# Patient Record
Sex: Male | Born: 1940 | Race: White | Hispanic: No | Marital: Single
Health system: Southern US, Community
[De-identification: ages and names within clinical notes are randomized; demographics above are authoritative.]

---

## 2007-10-10 ENCOUNTER — Emergency Department: Payer: Self-pay | Admitting: Internal Medicine

## 2007-10-10 ENCOUNTER — Other Ambulatory Visit: Payer: Self-pay

## 2008-01-10 ENCOUNTER — Other Ambulatory Visit: Payer: Self-pay

## 2008-01-10 ENCOUNTER — Inpatient Hospital Stay: Payer: Self-pay | Admitting: Specialist

## 2008-04-13 ENCOUNTER — Other Ambulatory Visit: Payer: Self-pay

## 2008-04-13 ENCOUNTER — Emergency Department: Payer: Self-pay | Admitting: Emergency Medicine

## 2008-04-23 ENCOUNTER — Other Ambulatory Visit: Payer: Self-pay

## 2008-04-23 ENCOUNTER — Emergency Department: Payer: Self-pay | Admitting: Emergency Medicine

## 2008-12-13 ENCOUNTER — Ambulatory Visit: Payer: Self-pay | Admitting: Gastroenterology

## 2010-03-01 IMAGING — CT CT HEAD WITHOUT CONTRAST
2 series · 16 of 30 positions shown, 20 images · non-contrast
Comparison: none

REASON FOR EXAM: R hand and R face weakness
COMMENTS:

[Series 2: without · axial · non-contrast · 0.42mm/px · z∈[+556,+680]mm · 13 of 31 slices shown, 17 images]
[im 3/31  brain]
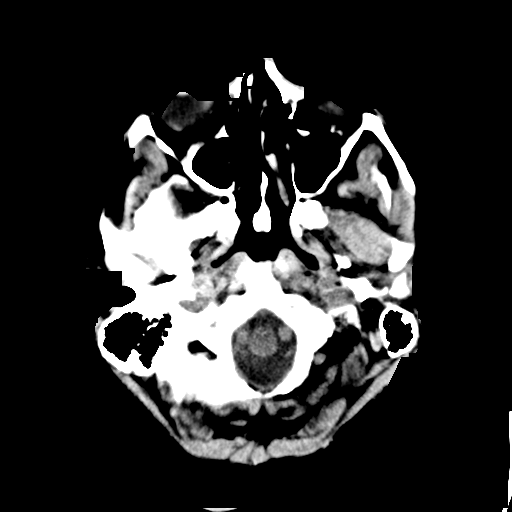
[im 3/31  bone]
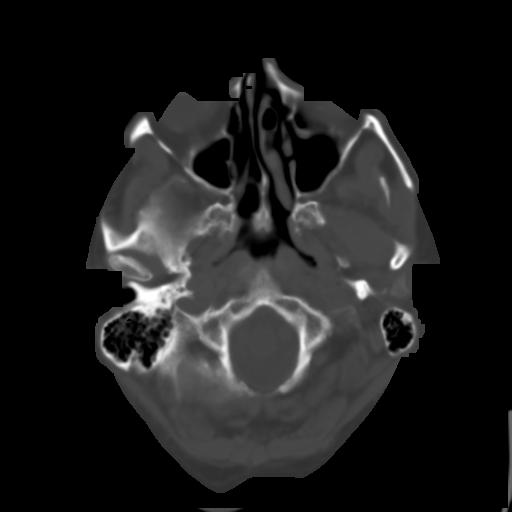
[im 5/31  brain]
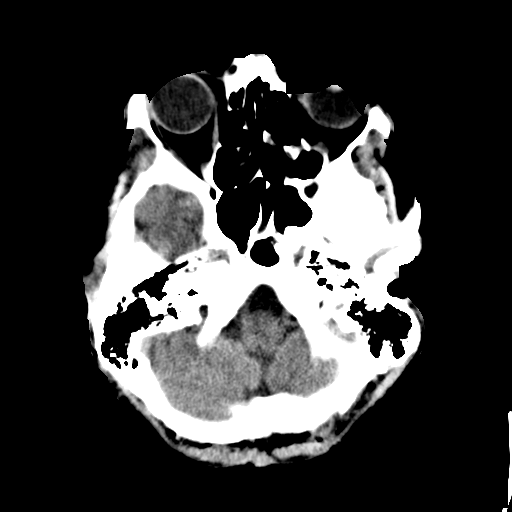
[im 7/31  brain]
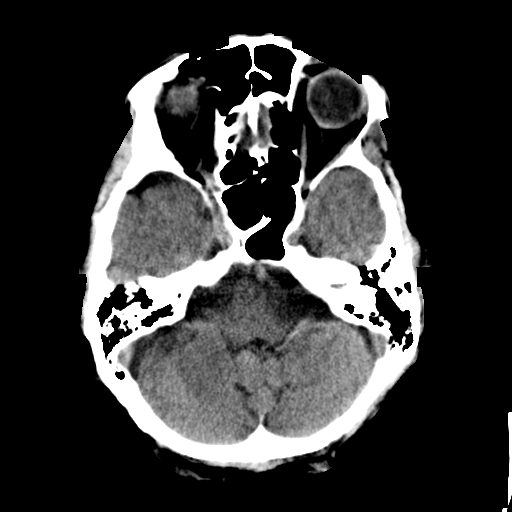
[im 9/31  brain]
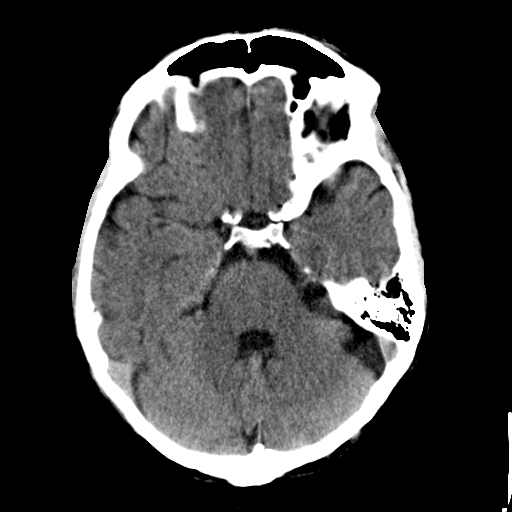
[im 11/31  brain]
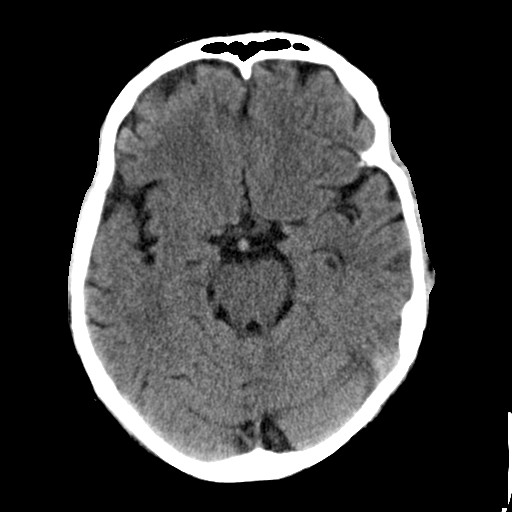
[im 11/31  bone]
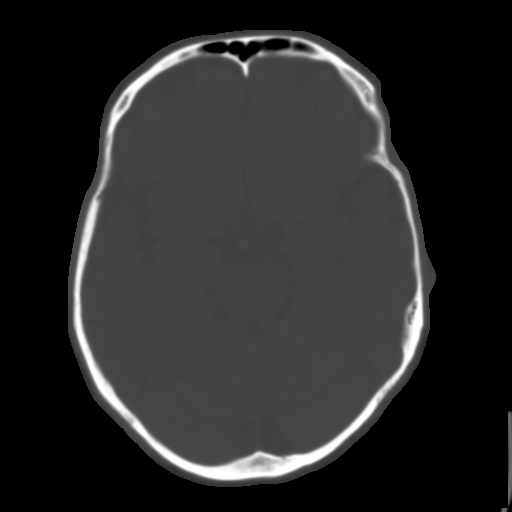
[im 13/31  brain]
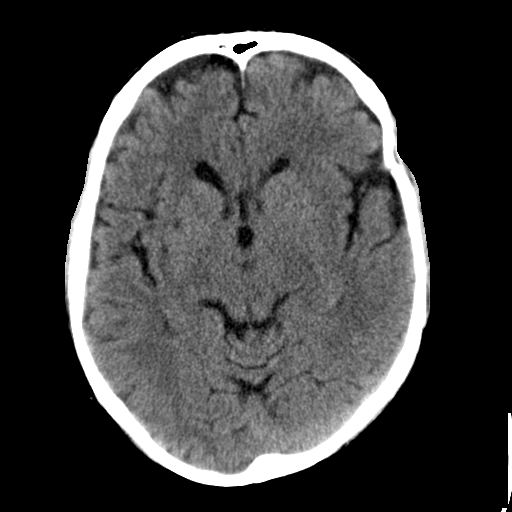
[im 16/31  brain]
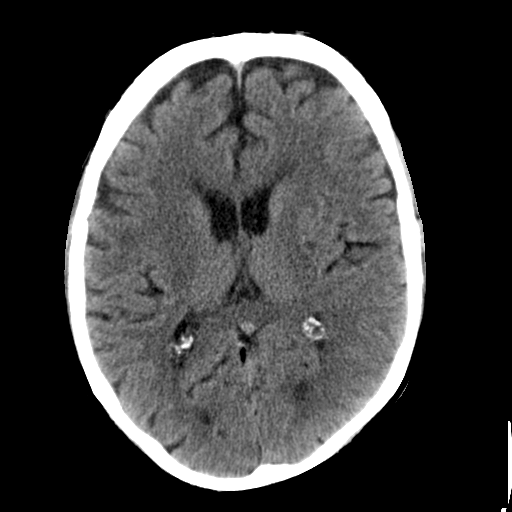
[im 18/31  brain]
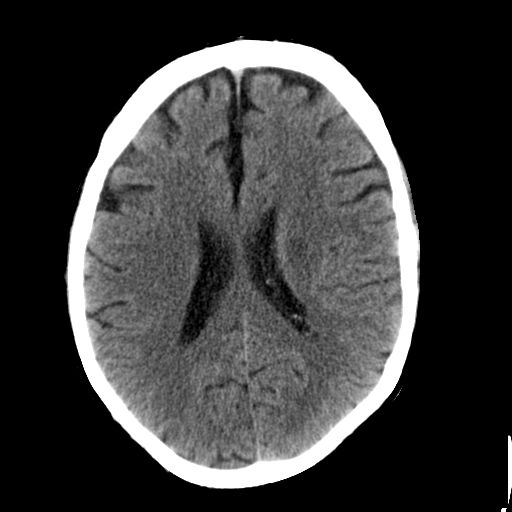
[im 20/31  brain]
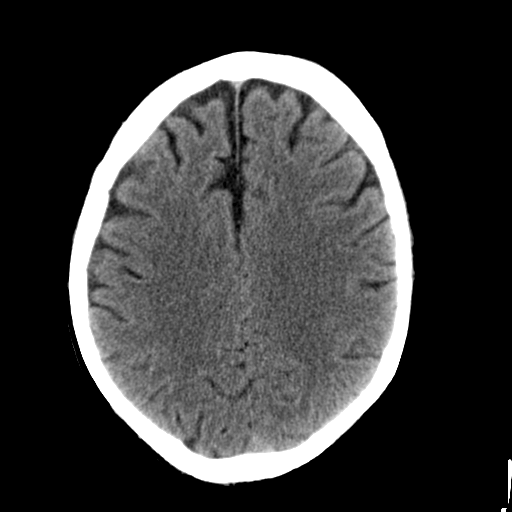
[im 20/31  bone]
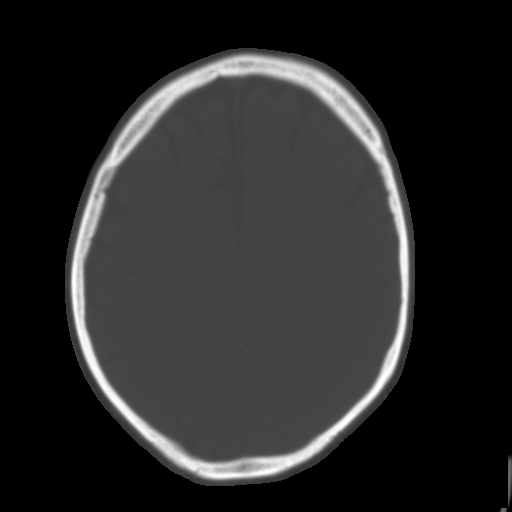
[im 22/31  brain]
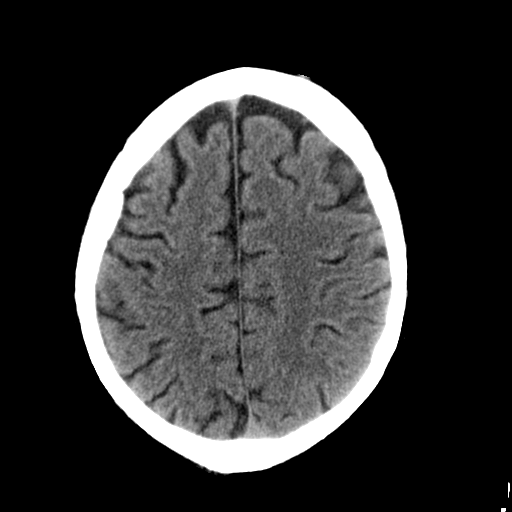
[im 24/31  brain]
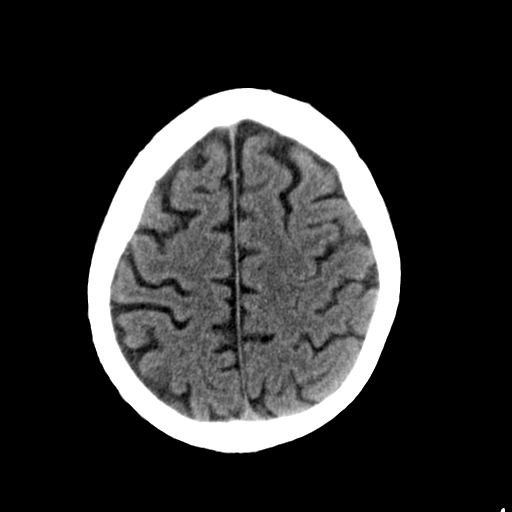
[im 26/31  brain]
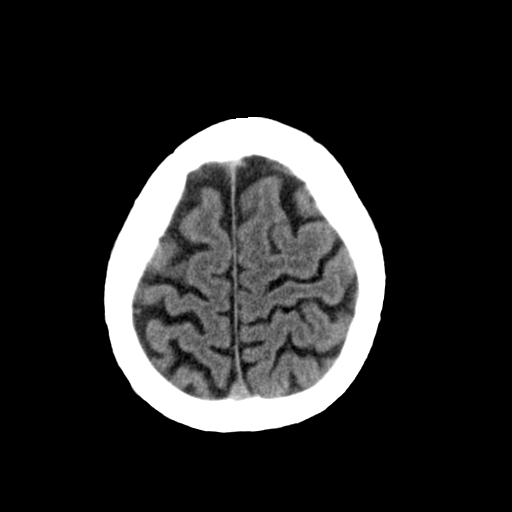
[im 28/31  brain]
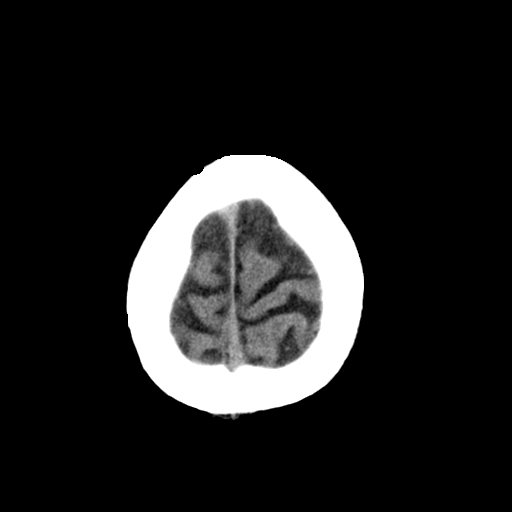
[im 28/31  bone]
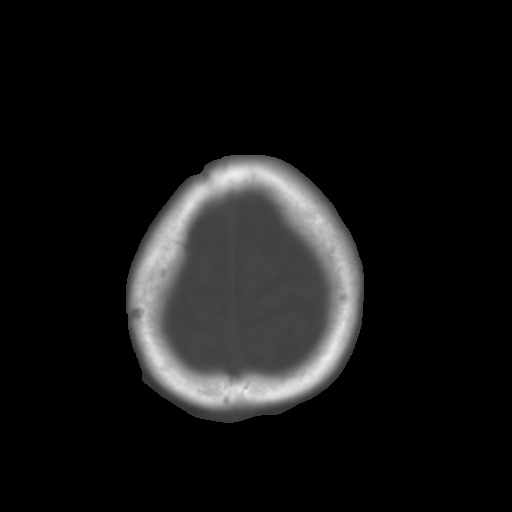

[Series 3: bone · axial · 0.42mm/px · z∈[+556,+596]mm · 3 of 31 slices shown]
[im 3/31  bone]
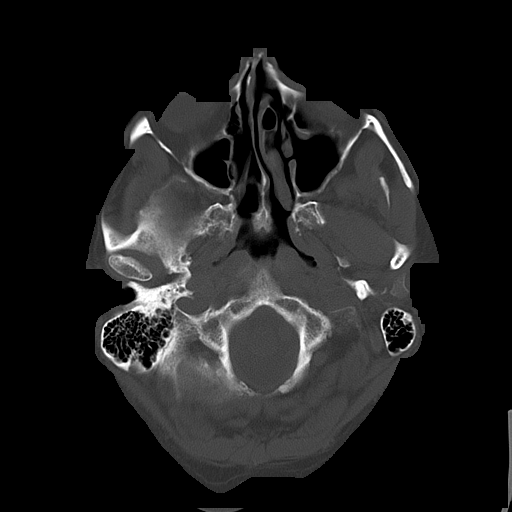
[im 7/31  bone]
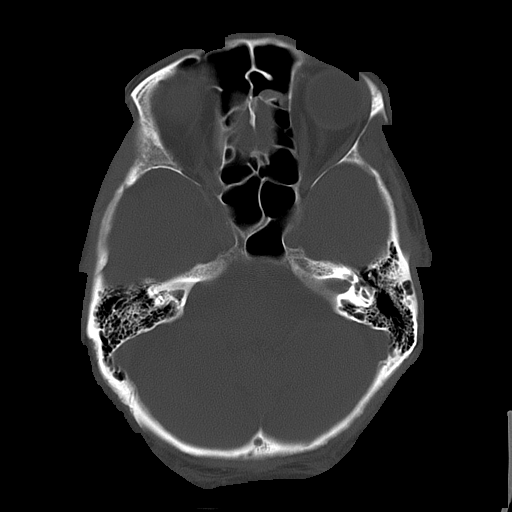
[im 11/31  bone]
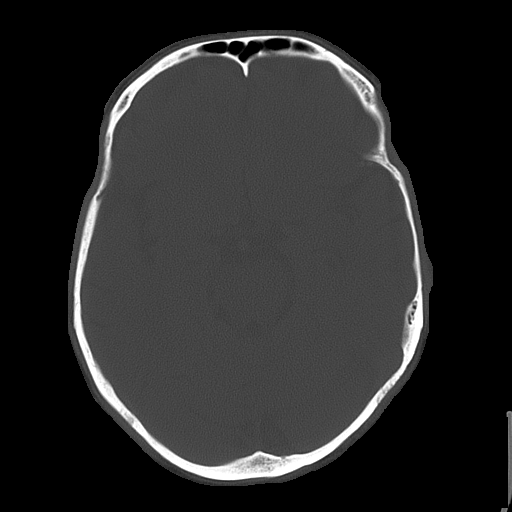

[16 of 30 positions shown; findings below may reference images not displayed]

PROCEDURE:     CT  - CT HEAD WITHOUT CONTRAST  - January 10, 2008 [DATE]

RESULT:     Noncontrast emergent CT of the brain is performed in the
standard fashion. There is no previous examination for comparison.

There is age-appropriate prominence of the ventricles and sulci. There does
not appear to be definite effacement, hemorrhage, mass, mass-effect or
midline shift. No extra-axial hematoma is evident. There is no territorial
infarct. The included paranasal sinuses demonstrate mucosal thickening
without pacing significant air-fluid level. The calvarium is intact. The
mastoids appear to be normal.
IMPRESSION: Evidence of age-appropriate atrophy. No acute intracranial
abnormality evident. Mucosal thickening is noted incidentally in the
paranasal sinuses. MRI followup may be beneficial.

## 2010-03-01 IMAGING — US US CAROTID DUPLEX BILAT
1 series · 17 of 24 positions shown · non-contrast
Comparison: none

REASON FOR EXAM: CVA/TIA
COMMENTS:

[Series 1: us carotid duplex bilat · 17 of 44 slices shown]
[im 1/44]
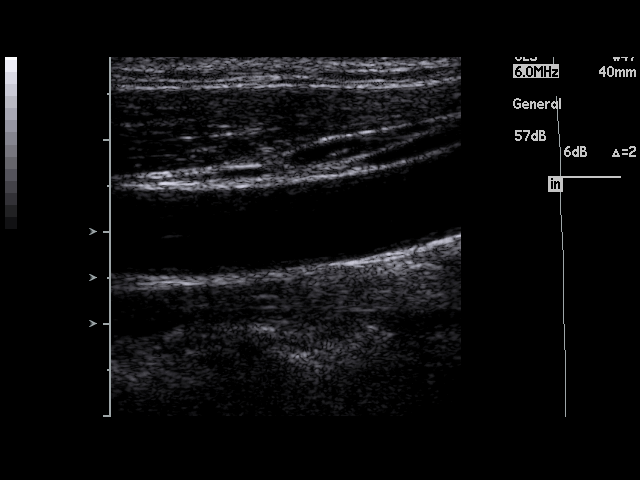
[im 4/44]
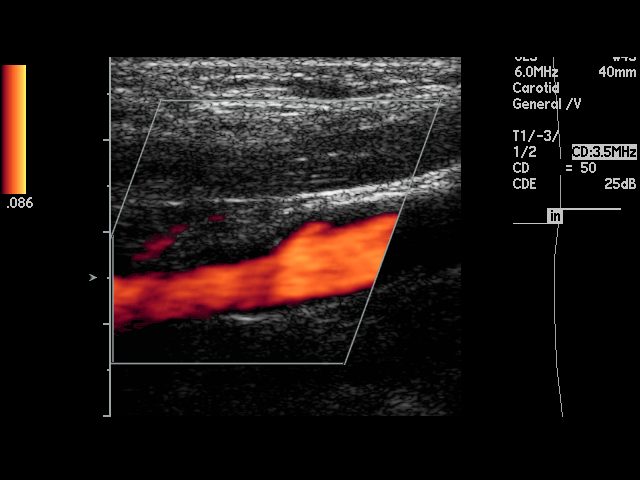
[im 6/44]
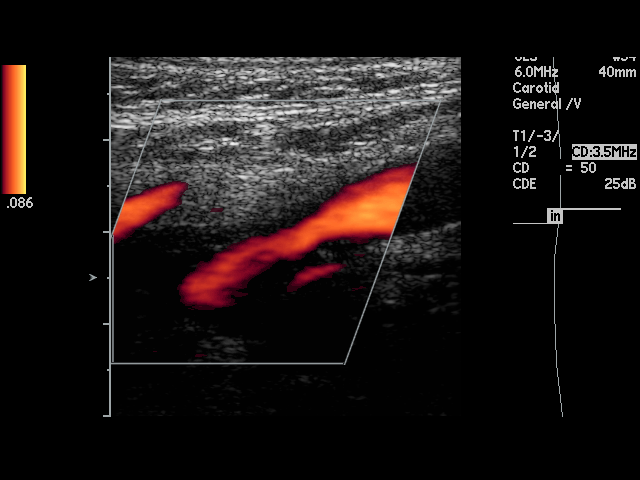
[im 8/44]
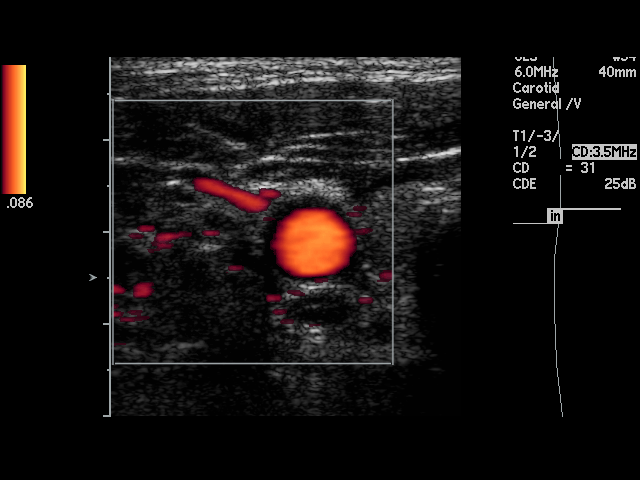
[im 12/44]
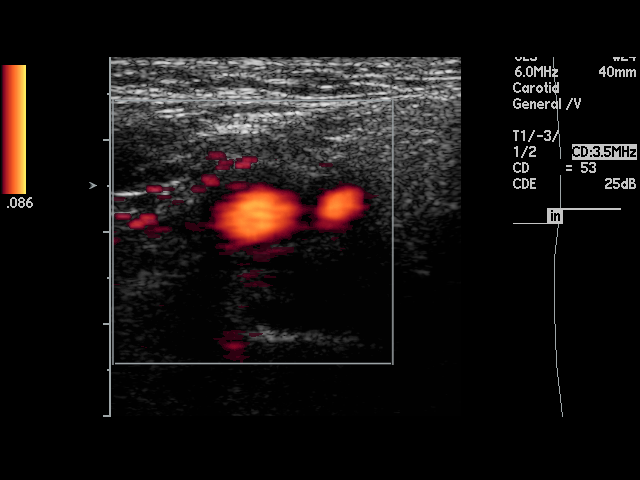
[im 14/44]
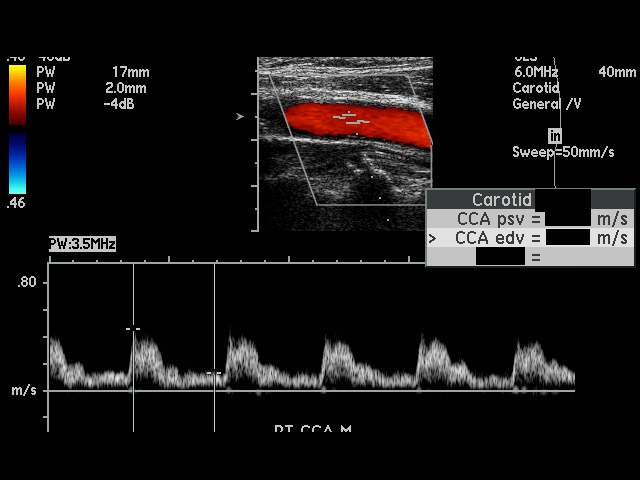
[im 17/44]
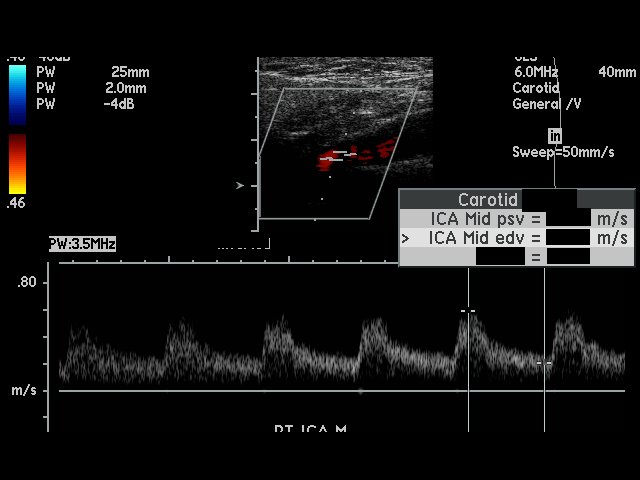
[im 19/44]
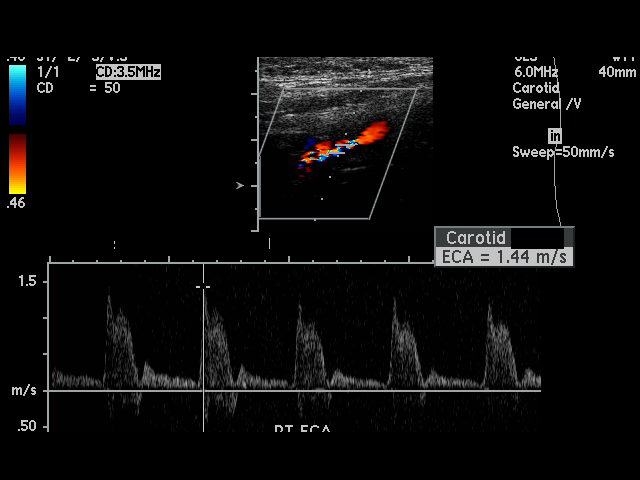
[im 23/44]
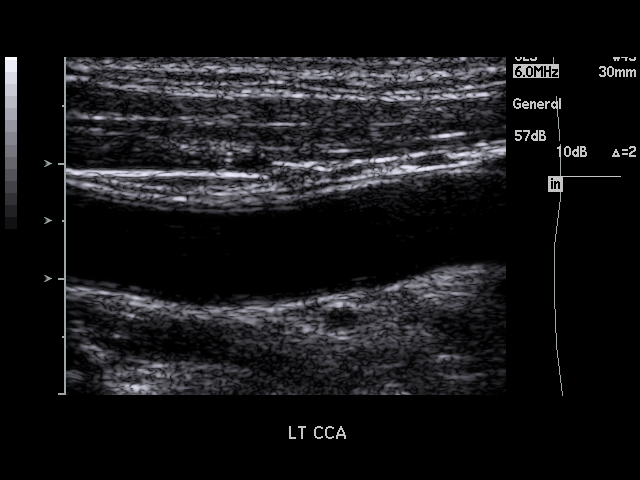
[im 25/44]
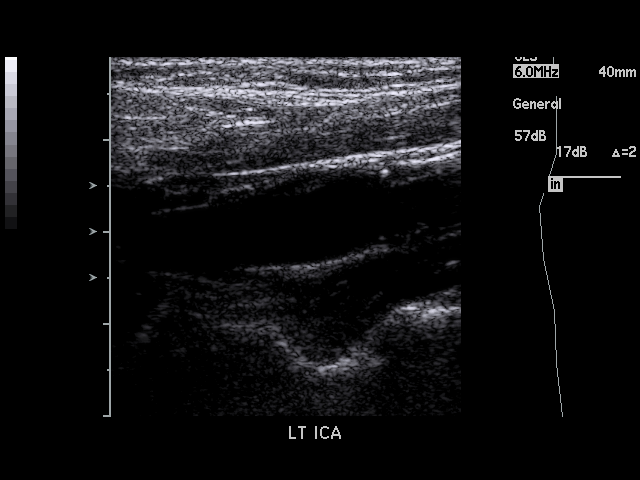
[im 27/44]
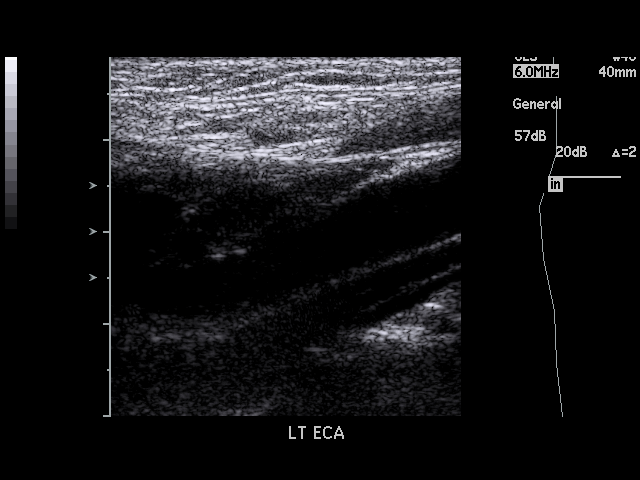
[im 30/44]
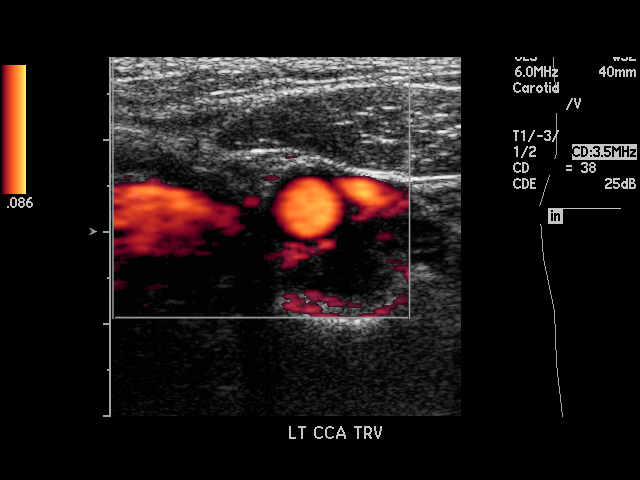
[im 32/44]
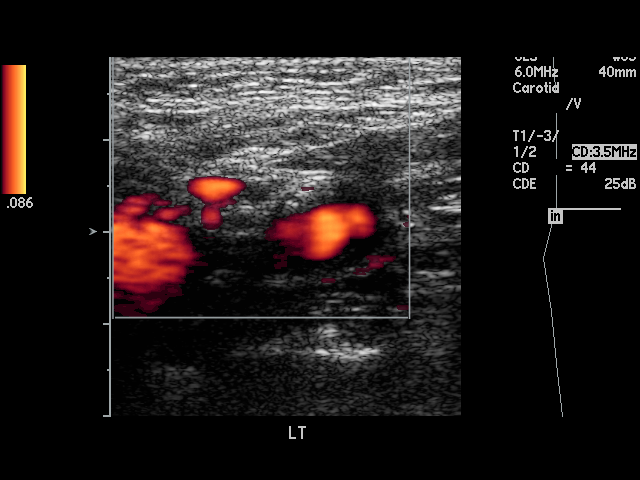
[im 36/44]
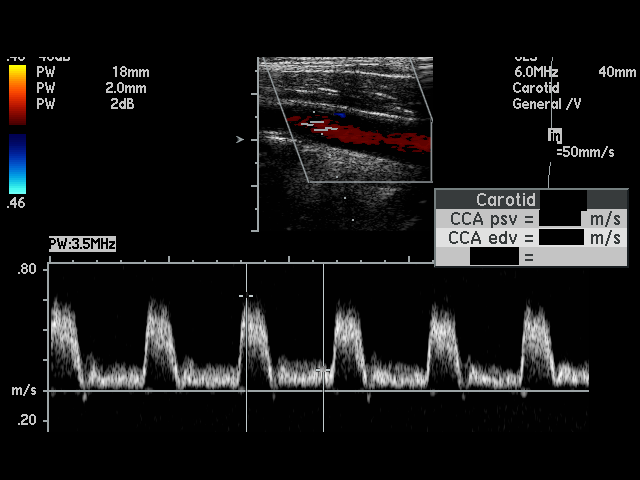
[im 38/44]
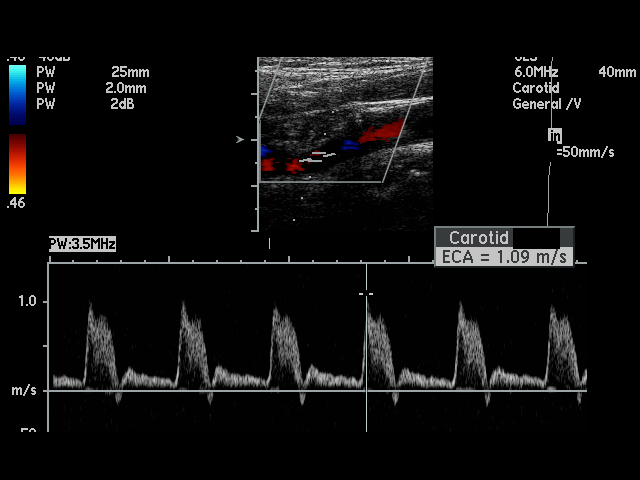
[im 40/44]
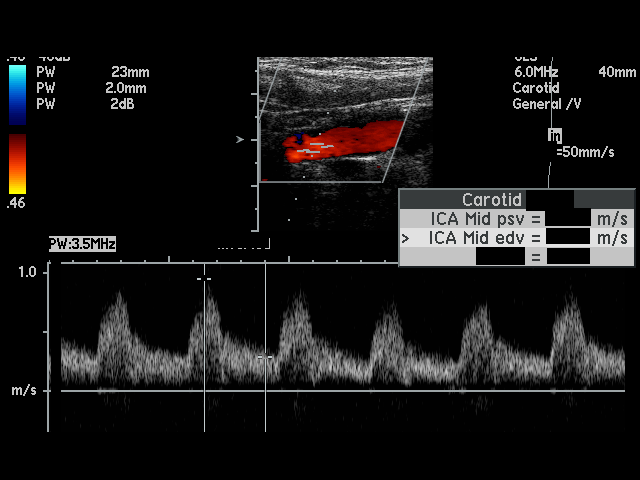
[im 44/44]
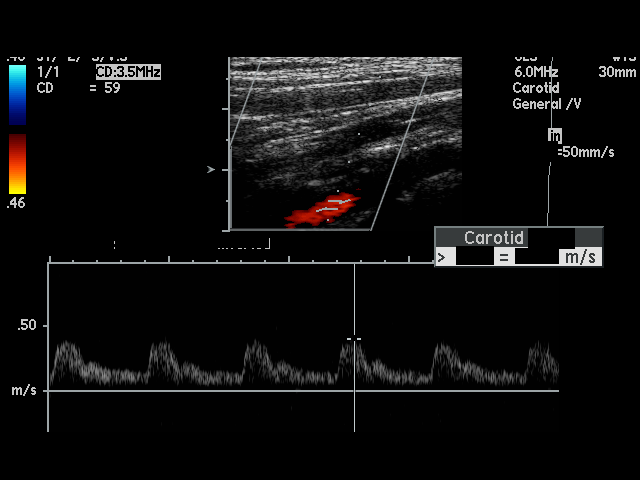

[17 of 24 positions shown; findings below may reference images not displayed]

PROCEDURE:     US  - US CAROTID DOPPLER BILATERAL  - January 10, 2008  [DATE]

RESULT:     There is slight mixed plaque formation about the carotid
bifurcations bilaterally. On the RIGHT, the peak RIGHT common carotid artery
flow velocity measures .583 meters per second and the peak RIGHT internal
carotid artery flow velocity measures 0.688 meters per second. The ICA/CCA
ratio is 1.180.  On the LEFT, the peak LEFT common carotid artery flow
velocity measures 0.631 meters per second and the peak LEFT internal carotid
artery flow velocity measures 0.897 meters per second. The ICA/CCA ratio is
1.517.  These findings bilaterally are within the normal range and
consistent with the absence of hemodynamically significant stenosis.  There
is antegrade flow in both vertebrals.
IMPRESSION: 1. No hemodynamically significant stenosis is seen on either side.
2. There is antegrade flow in both vertebrals.

## 2010-12-30 ENCOUNTER — Ambulatory Visit: Payer: Self-pay | Admitting: Gastroenterology

## 2010-12-31 LAB — PATHOLOGY REPORT

## 2013-02-21 ENCOUNTER — Ambulatory Visit: Payer: Self-pay | Admitting: Gastroenterology

## 2013-02-22 LAB — PATHOLOGY REPORT

## 2015-08-04 ENCOUNTER — Emergency Department
Admission: EM | Admit: 2015-08-04 | Discharge: 2015-08-04 | Disposition: A | Discharge status: Other Acute Inpatient Hospital | Payer: Medicare Other | Attending: Emergency Medicine | Admitting: Emergency Medicine

## 2015-08-04 ENCOUNTER — Emergency Department: Payer: Medicare Other

## 2015-08-04 DIAGNOSIS — R092 Respiratory arrest: Secondary | ICD-10-CM

## 2015-08-04 DIAGNOSIS — T80212A Local infection due to central venous catheter, initial encounter: Secondary | ICD-10-CM | POA: Insufficient documentation

## 2015-08-04 DIAGNOSIS — N183 Chronic kidney disease, stage 3 (moderate): Secondary | ICD-10-CM | POA: Diagnosis not present

## 2015-08-04 DIAGNOSIS — R55 Syncope and collapse: Secondary | ICD-10-CM | POA: Diagnosis present

## 2015-08-04 DIAGNOSIS — E119 Type 2 diabetes mellitus without complications: Secondary | ICD-10-CM | POA: Diagnosis not present

## 2015-08-04 DIAGNOSIS — I129 Hypertensive chronic kidney disease with stage 1 through stage 4 chronic kidney disease, or unspecified chronic kidney disease: Secondary | ICD-10-CM | POA: Diagnosis not present

## 2015-08-04 DIAGNOSIS — Y658 Other specified misadventures during surgical and medical care: Secondary | ICD-10-CM | POA: Diagnosis not present

## 2015-08-04 DIAGNOSIS — I469 Cardiac arrest, cause unspecified: Secondary | ICD-10-CM

## 2015-08-04 DIAGNOSIS — J939 Pneumothorax, unspecified: Secondary | ICD-10-CM

## 2015-08-04 DIAGNOSIS — R4189 Other symptoms and signs involving cognitive functions and awareness: Secondary | ICD-10-CM

## 2015-08-04 LAB — CBC WITH DIFFERENTIAL/PLATELET
BASOS ABS: 0.1 10*3/uL (ref 0–0.1)
EOS ABS: 0.2 10*3/uL (ref 0–0.7)
HCT: 45.7 % (ref 40.0–52.0)
Hemoglobin: 14.7 g/dL (ref 13.0–18.0)
LYMPHS ABS: 3 10*3/uL (ref 1.0–3.6)
Lymphocytes Relative: 21 %
MCH: 32.5 pg (ref 26.0–34.0)
MCHC: 32.2 g/dL (ref 32.0–36.0)
MCV: 101.1 fL — ABNORMAL HIGH (ref 80.0–100.0)
Monocytes Absolute: 2.1 10*3/uL — ABNORMAL HIGH (ref 0.2–1.0)
Monocytes Relative: 15 %
Neutro Abs: 8.8 10*3/uL — ABNORMAL HIGH (ref 1.4–6.5)
PLATELETS: 124 10*3/uL — AB (ref 150–440)
RBC: 4.52 MIL/uL (ref 4.40–5.90)
RDW: 14.8 % — AB (ref 11.5–14.5)
WBC: 14.2 10*3/uL — AB (ref 3.8–10.6)

## 2015-08-04 LAB — BLOOD GAS, ARTERIAL
Acid-base deficit: 7.9 mmol/L — ABNORMAL HIGH (ref 0.0–2.0)
BICARBONATE: 21.2 meq/L (ref 21.0–28.0)
FIO2: 1
MECHANICAL RATE: 20
MECHVT: 500 mL
O2 Saturation: 91.3 %
PEEP/CPAP: 10 cmH2O
Patient temperature: 37
pCO2 arterial: 58 mmHg — ABNORMAL HIGH (ref 32.0–48.0)
pH, Arterial: 7.17 — CL (ref 7.350–7.450)
pO2, Arterial: 78 mmHg — ABNORMAL LOW (ref 83.0–108.0)

## 2015-08-04 LAB — COMPREHENSIVE METABOLIC PANEL
ALT: 62 U/L (ref 17–63)
AST: 66 U/L — ABNORMAL HIGH (ref 15–41)
Albumin: 3.6 g/dL (ref 3.5–5.0)
Alkaline Phosphatase: 121 U/L (ref 38–126)
Anion gap: 16 — ABNORMAL HIGH (ref 5–15)
BILIRUBIN TOTAL: 1.2 mg/dL (ref 0.3–1.2)
BUN: 39 mg/dL — ABNORMAL HIGH (ref 6–20)
CALCIUM: 8.4 mg/dL — AB (ref 8.9–10.3)
CHLORIDE: 103 mmol/L (ref 101–111)
CO2: 16 mmol/L — ABNORMAL LOW (ref 22–32)
CREATININE: 3.02 mg/dL — AB (ref 0.61–1.24)
GFR, EST AFRICAN AMERICAN: 22 mL/min — AB (ref >60)
GFR, EST NON AFRICAN AMERICAN: 19 mL/min — AB (ref >60)
Glucose, Bld: 308 mg/dL — ABNORMAL HIGH (ref 65–99)
Potassium: 4.7 mmol/L (ref 3.5–5.1)
Sodium: 135 mmol/L (ref 135–145)
TOTAL PROTEIN: 6.8 g/dL (ref 6.5–8.1)

## 2015-08-04 LAB — ETHANOL

## 2015-08-04 LAB — ACETAMINOPHEN LEVEL: Acetaminophen (Tylenol), Serum: <10 ug/mL — ABNORMAL LOW (ref 10–30)

## 2015-08-04 LAB — TROPONIN I: TROPONIN I: 0.05 ng/mL — AB (ref <0.031)

## 2015-08-04 LAB — LACTIC ACID, PLASMA: Lactic Acid, Venous: 12.9 mmol/L (ref 0.5–2.0)

## 2015-08-04 MED ORDER — NOREPINEPHRINE 4 MG/250ML-% IV SOLN: 0.0000 ug/min | INTRAVENOUS | Status: DC

## 2015-08-04 MED ORDER — NOREPINEPHRINE BITARTRATE 1 MG/ML IV SOLN
0.0000 ug/min | Freq: Once | INTRAVENOUS | Status: DC
  Administered 2015-08-04: 20 ug/min via INTRAVENOUS

## 2015-08-04 MED ORDER — SODIUM BICARBONATE 8.4 % IV SOLN
INTRAVENOUS | Status: AC
  Administered 2015-08-04: 100 meq via INTRAVENOUS
  Filled 2015-08-04: qty 50

## 2015-08-04 MED ORDER — VASOPRESSIN 20 UNIT/ML IV SOLN
0.0300 [IU]/min | INTRAVENOUS | Status: DC
  Administered 2015-08-04: 0.03 [IU]/min via INTRAVENOUS
  Filled 2015-08-04: qty 2

## 2015-08-04 MED ORDER — EPINEPHRINE HCL 0.1 MG/ML IJ SOSY
PREFILLED_SYRINGE | INTRAMUSCULAR | Status: AC | PRN
  Administered 2015-08-04 (×3): 1 mg via INTRAVENOUS

## 2015-08-04 MED ORDER — MIDAZOLAM HCL 5 MG/5ML IJ SOLN
1.0000 mg | Freq: Once | INTRAMUSCULAR | Status: AC
  Administered 2015-08-04: 1 mg via INTRAVENOUS

## 2015-08-04 MED ORDER — ATROPINE SULFATE 1 MG/ML IJ SOLN
INTRAMUSCULAR | Status: AC | PRN
Start: 2015-08-04 — End: 2015-08-04
  Administered 2015-08-04 (×2): 1 mg via INTRAVENOUS

## 2015-08-04 MED ORDER — NOREPINEPHRINE BITARTRATE 1 MG/ML IV SOLN
0.0000 ug/min | Freq: Once | INTRAVENOUS | Status: DC
  Administered 2015-08-04: 2 ug/min via INTRAVENOUS

## 2015-08-04 MED ORDER — FENTANYL CITRATE (PF) 100 MCG/2ML IJ SOLN
50.0000 ug | Freq: Once | INTRAMUSCULAR | Status: AC
  Administered 2015-08-04: 50 ug via INTRAVENOUS

## 2015-08-04 MED ORDER — DOPAMINE-DEXTROSE 3.2-5 MG/ML-% IV SOLN
0.0000 ug/kg/min | Freq: Once | INTRAVENOUS | Status: AC
  Administered 2015-08-04: 5 ug/kg/min via INTRAVENOUS

## 2015-08-04 MED ORDER — SODIUM BICARBONATE 8.4 % IV SOLN
100.0000 meq | Freq: Once | INTRAVENOUS | Status: AC
  Administered 2015-08-04: 100 meq via INTRAVENOUS

## 2015-08-04 MED ORDER — NOREPINEPHRINE 4 MG/250ML-% IV SOLN
0.0000 ug/min | Freq: Once | INTRAVENOUS | Status: AC
  Administered 2015-08-04: 4 ug/min via INTRAVENOUS

## 2015-08-04 MED ORDER — SODIUM CHLORIDE 0.9 % IV SOLN
INTRAVENOUS | Status: AC | PRN
Start: 2015-08-04 — End: 2015-08-04
  Administered 2015-08-04: 1000 mL via INTRAVENOUS

## 2015-08-04 MED ORDER — FENTANYL CITRATE (PF) 100 MCG/2ML IJ SOLN
INTRAMUSCULAR | Status: AC | PRN
  Administered 2015-08-04: 50 ug via INTRAVENOUS

## 2015-08-04 MED ORDER — ETOMIDATE 2 MG/ML IV SOLN
INTRAVENOUS | Status: AC | PRN
  Administered 2015-08-04: 30 mg via INTRAVENOUS

## 2015-08-04 MED ORDER — SODIUM BICARBONATE 8.4 % IV SOLN
50.0000 meq | Freq: Once | INTRAVENOUS | Status: AC
  Administered 2015-08-04: 50 meq via INTRAVENOUS

## 2015-08-06 MED FILL — Medication: Qty: 1 | Status: AC

## 2015-08-09 LAB — BLOOD GAS, ARTERIAL
FIO2: 1
Mechanical Rate: 20
PCO2 ART: 72 mmHg — AB (ref 32.0–48.0)
PEEP/CPAP: 10 cmH2O
PH ART: 6.9 — AB (ref 7.350–7.450)
PO2 ART: 97 mmHg (ref 83.0–108.0)
Patient temperature: 37
VT: 500 mL

## 2015-08-29 NOTE — ED Notes (Signed)
[facility] at bedside.

## 2015-08-29 NOTE — ED Notes (Addendum)
[facility] team at bedside, setting patient up on cardiac monitor and transferring patient onto backboard.

## 2015-08-29 NOTE — ED Provider Notes (Signed)
[facility] Emergency Department Provider Note  ____________________________________________  Time seen: Approximately 0045 AM  I have reviewed the triage vital signs and the nursing notes.   HISTORY  Chief Complaint Loss of Consciousness  Patient in severe distress and unresponsive on arrival.  HPI Robert Lucero is a 74 y.o. male who drove himself to the emergency department and reported that he felt like he was going to die while in triage. The patient was brought back to a room with some severe respiratory distress and then stopped breathing.   Past medical history Insomnia GERD CAD Hypertension Hyperlipidemia Peripheral vascular disease Abdominal aortic aneurysm Stage III kidney disease Atrial fibrillation Aortic stenosis Ischemic cardiomyopathy Type 2 diabetes CVA  Patient Active Problem List   Diagnosis Date Noted  . Pneumothorax on right 08-31-2015    Past surgical history Pacemaker/defibrillator Cardiac catheterization  Meds Unable to assess due to patient unresponsiveness  Allergies Review of patient's allergies indicates not on file. unable to assess due to patient unresponsiveness.  No family history on file. unable to assess due to patient unresponsiveness  Social History Social History  Substance Use Topics  . Smoking status: Unable to assess due to patient unresponsiveness  . Smokeless tobacco: Unable to assess due to patient unresponsiveness  . Alcohol Use: Unable to assess due to patient unresponsiveness    Review of Systems Unable to assess due to patient severe respiratory distress  ____________________________________________   PHYSICAL EXAM:  VITAL SIGNS: ED Triage Vitals  Enc Vitals Group     BP 08-31-2015 0054 155/25 mmHg     Pulse Rate 08/31/2015 0047 147     Resp August 31, 2015 0054 89     Temp 08-31-2015 0140 101.8 F (38.8 C)     Temp Source Aug 31, 2015 0140 Core     SpO2 Aug 31, 2015 0054 100 %     Weight  08-31-2015 0119 214 lb 11.7 oz (97.4 kg)     Height --      Head Cir --      Peak Flow --      Pain Score --      Pain Loc --      Pain Edu? --      Excl. in GC? --     Constitutional: In severe respiratory distress, gray and ashen in color nonresponsive. Eyes: Conjunctivae are normal. PERRL. EOMI. Head: Atraumatic. Nose: No congestion/rhinnorhea. Mouth/Throat: Mucous membranes are moist.  Oropharynx non-erythematous. Neck: No stridor.  Cardiovascular: No heart tones auscultated Respiratory: Coarse rhonchi throughout all lung fields Gastrointestinal: Soft and nontender. No distention.  Musculoskeletal: No lower extremity tenderness nor edema.   Neurologic:  Agonal respirations no response to painful stimuli not answering questions. Skin: Cool and mottled Psychiatric: Unresponsive  ____________________________________________   LABS (all labs ordered are listed, but only abnormal results are displayed)  Labs Reviewed  COMPREHENSIVE METABOLIC PANEL - Abnormal; Notable for the following:    CO2 16 (*)    Glucose, Bld 308 (*)    BUN 39 (*)    Creatinine, Ser 3.02 (*)    Calcium 8.4 (*)    AST 66 (*)    GFR calc non Af Amer 19 (*)    GFR calc Af Amer 22 (*)    Anion gap 16 (*)    All other components within normal limits  CBC WITH DIFFERENTIAL/PLATELET - Abnormal; Notable for the following:    WBC 14.2 (*)    MCV 101.1 (*)    RDW 14.8 (*)  Platelets 124 (*)    Neutro Abs 8.8 (*)    Monocytes Absolute 2.1 (*)    All other components within normal limits  TROPONIN I - Abnormal; Notable for the following:    Troponin I 0.05 (*)    All other components within normal limits  ACETAMINOPHEN LEVEL - Abnormal; Notable for the following:    Acetaminophen (Tylenol), Serum <10 (*)    All other components within normal limits  BLOOD GAS, ARTERIAL - Abnormal; Notable for the following:    pCO2 arterial 72 (*)    All other components within normal limits  LACTIC ACID, PLASMA -  Abnormal; Notable for the following:    Lactic Acid, Venous 12.9 (*)    All other components within normal limits  BLOOD GAS, ARTERIAL - Abnormal; Notable for the following:    pH, Arterial 7.17 (*)    pCO2 arterial 58 (*)    pO2, Arterial 78 (*)    Acid-base deficit 7.9 (*)    All other components within normal limits  ETHANOL  LACTIC ACID, PLASMA  URINALYSIS COMPLETEWITH MICROSCOPIC ([facility] ONLY)  PROTIME-INR   ____________________________________________  EKG  ED ECG REPORT I, Rebecka Apley, the attending physician, personally viewed and interpreted this ECG.   Date: August 30, 2015  EKG Time: 1041  Rate: 146  Rhythm: sinus tachycardia wide complex  Axis: Normal  Intervals:right bundle branch block and left posterior fascicular block  ST&T Change: Flipped T waves in leads 3 and V1 with no ST segment elevation.  ____________________________________________  RADIOLOGY  Chest x-ray: Small right pneumothorax, diffuse bilateral lung interstitial thickening and hazy airspace opacity consistent with pulmonary edema, right IJ central venous line tip in the lower superior vena cava, endotracheal tube tip 2.3 cm above the carina, nasogastric tube tip passing below the diaphragm and below the included field of view. ____________________________________________   PROCEDURES  Procedure(s) performed: Please, see procedure note(s).   INTUBATION Performed by: Lucrezia Europe P  Required items: required blood products, implants, devices, and special equipment available Patient identity confirmed: provided demographic data and hospital-assigned identification number Time out: Immediately prior to procedure a "time out" was called to verify the correct patient, procedure, equipment, support staff and site/side marked as required.  Indications: respiratory arrest, cardiac arrest  Intubation method: Glidescope Laryngoscopy   Preoxygenation: BVM  Sedatives: Etomidate   Tube  Size: 7.5 cuffed  Post-procedure assessment: chest rise and ETCO2 monitor Breath sounds: equal and absent over the epigastrium Tube secured with: ETT holder Chest x-ray interpreted by radiologist and me.  Chest x-ray findings: endotracheal tube in appropriate position  Patient tolerated the procedure well with no immediate complications.  CENTRAL LINE Performed by: Lucrezia Europe P Consent: The procedure was performed in an emergent situation. Required items: required blood products, implants, devices, and special equipment available Patient identity confirmed: arm band and provided demographic data Time out: Immediately prior to procedure a "time out" was called to verify the correct patient, procedure, equipment, support staff and site/side marked as required. Indications: vascular access Patient sedated: no Preparation: skin prepped with 2% chlorhexidine Skin prep agent dried: skin prep agent completely dried prior to procedure Sterile barriers: all five maximum sterile barriers used - cap, mask, sterile gown, sterile gloves, and large sterile sheet Hand hygiene: hand hygiene performed prior to central venous catheter insertion  Location details: Right Internal Jugular  Catheter type: triple lumen Catheter size: 8 Fr Pre-procedure: landmarks identified Ultrasound guidance: yes Successful placement: yes Post-procedure: line  sutured and dressing applied Assessment: blood return through all parts, free fluid flow, placement verified by x-ray and no pneumothorax on x-ray Patient tolerance: Patient tolerated the procedure well with no immediate complications.         Critical Care performed: Yes, see critical care note(s)  CRITICAL CARE Performed by: Lucrezia EuropeWebster,  Allison P   Total critical care time: 90 minutes  Critical care time was exclusive of separately billable procedures and treating other patients.  Critical care was necessary to treat or prevent imminent or  life-threatening deterioration.  Critical care was time spent personally by me on the following activities: development of treatment plan with patient and/or surrogate as well as nursing, discussions with consultants, evaluation of patient's response to treatment, examination of patient, obtaining history from patient or surrogate, ordering and performing treatments and interventions, ordering and review of laboratory studies, ordering and review of radiographic studies, pulse oximetry and re-evaluation of patient's condition.  ____________________________________________   INITIAL IMPRESSION / ASSESSMENT AND PLAN / ED COURSE  Pertinent labs & imaging results that were available during my care of the patient were reviewed by me and considered in my medical decision making (see chart for details).  This is a 74 year old male who comes into the hospital today with the complaint that he thinks he is going to die. When the patient was brought immediately back to a room he stopped breathing and coated. We initially did CPR on the patient and administered 2 doses of epinephrine 1 dose of atropine and a dose of sodium bicarbonate. The patient does have a pacemaker so he continued to have PTA on exam. We were able to obtain a blood pressure and a pulse after intubation as well as the medication. I place a central line immediately and did check for blood work. Patient's initial pH was very low so I did give him 2 more amps of sodium bicarbonate. The patient received 2 L of normal saline initially and as his pressure started to decline he was started on initially dopamine and then onto Levophed. The patient had coarse breath sounds but his oxygen saturation was appropriate. I did look at the x-ray and it was found that the patient had pneumothorax. We were also informed that we did not have any intensive care unit beds available so the patient is unable to stay here in the emergency department or at [facility]. [facility] I spoke with the emergency medicine physician Marisa SeverinOlga Otter who agrees to accept the patient to the emergency department. The computer system also did go on downtime so at the time of transfer all the patient's lab work was not available. The patient developed some swelling in his scrotum without any urine output. We attempted to check the patient's catheter but was not getting any drainage. The decision was made to pull the catheter and attempted to place another. We again attempted to place another but there was blood within the catheter so the decision was made to pull out the catheter and [facility] We contacted Dr. Egbert GaribaldiBird the surgeon on-call to place the patient's chest tube and [facility]. The patient did have some decrease in his blood pressure as well as his oxygen saturation as they were getting him ready to [facility]. We decided to start the patient on vasopressin to help with his pressure and his PEEP was increased  to 10. At this time we are still unsure what may have occurred with the patient. I spoke with his friend who reports that they had dinner tonight and the patient had no complaints this evening. The patient will be transferred to [facility] for further evaluation and treatment. ____________________________________________   FINAL CLINICAL IMPRESSION(S) / ED DIAGNOSES  Final diagnoses:  Unresponsive  Central line insertion site infection, initial encounter  Cardiac arrest [facility])  Respiratory arrest (HCC)      Rebecka Apley, MD 09-02-2015 [phone]

## 2015-08-29 NOTE — Code Documentation (Signed)
No pulse, ACLS protocol initiated.

## 2015-08-29 NOTE — Code Documentation (Signed)
Central line kit at bedside

## 2015-08-29 NOTE — Code Documentation (Signed)
Pulse noted

## 2015-08-29 NOTE — Op Note (Signed)
*   No surgery found *  2:32 AM  PATIENT:  Robert Lucero  74 y.o. male  PRE-OPERATIVE DIAGNOSIS:  Right pneumothorax  POST-OPERATIVE DIAGNOSIS:  Right pneumothorax and hemothorax  PROCEDURE: Insertion right 32 French tube thoracostomy  SURGEON:  Raynald KempMark A Devontaye Ground, MD  ASSISTANTS: None  ANESTHESIA: 5 cc 1% lidocaine, 2 mg intravenous Versed and 25 g intravenous fentanyl    SPECIMEN: None  EBL: Approximately 50 cc of blood.  Findings hemopneumothorax  Description of procedure:    This is an emergent procedure. The patient had just undergone CPR. Chest x-ray demonstrates a 30% right pneumothorax. There are multiple rib fractures. On examination the patient has subcutaneous emphysema. No consent was obtained from the family. Following sterile prep and drape on the right side a midaxillary line incision was fashioned over the approximate the fifth sixth interspace. Superior border of the rib was identified. Large Kelly clamp was introduced into the pleural space with a large rush of air and blood. A 36 French straight chest tube was inserted. It looked to go into the fissure could not reposition it was a security approximate 5 cm skin. Sterile dressing was applied. The patient will be [facility] as our intensive care unit is full.   Raynald KempMark A Kalab Camps, MD, FACS

## 2015-08-29 NOTE — Code Documentation (Signed)
No pulse, ACLS initiated.

## 2015-08-29 NOTE — ED Notes (Signed)
EPIC system running at this time. See downtime charting sheet.

## 2015-08-29 NOTE — ED Notes (Signed)
Nitrostat tablets [facility], labeled and placed in specimen bag. All other items given to family.

## 2015-08-29 NOTE — ED Notes (Signed)
X-ray at bedside

## 2015-08-29 NOTE — ED Notes (Signed)
Vasopressin bag given to [facility].

## 2015-08-29 NOTE — Code Documentation (Signed)
Unsuccessful attempts on IV access

## 2015-08-29 NOTE — Code Documentation (Signed)
PEA rhythm noted.

## 2015-08-29 NOTE — ED Notes (Signed)
Pt emergency contact has been contacted and reports being on way to hospital.

## 2015-08-29 NOTE — ED Notes (Signed)
Vasopressin ordered per MD Zenda AlpersWebster. Flight team made aware. Flight team awaiting drip infusion.

## 2015-08-29 NOTE — ED Notes (Addendum)
Pt arrived to ER via POV with c/o of SOB and respiratory distress. Pt rushed to treatment room 10 in wheelchair, sitting upright and stating "I cannot breathe." Pt noted diaphoretic and pale to face. MD paged STAT to treatment room.

## 2015-08-29 NOTE — Code Documentation (Signed)
ACLS protocol initiated

## 2015-08-29 NOTE — ED Notes (Signed)
[facility] given [facility] per [place], Charity fundraiserN.

## 2015-08-29 NOTE — Code Documentation (Signed)
Pulse check.   

## 2015-08-29 DEATH — deceased

## 2017-09-23 IMAGING — CR DG CHEST 1V PORT
1 series · 1 of 1 positions shown · non-contrast
Comparison: None.

CLINICAL DATA: Central line insertion site infection, initial
encounter [id number].BZB3 (H7Q-SO-CM)
Unresponsive 92E.2 (H7Q-SO-CM)

EXAM:
PORTABLE CHEST 1 VIEW

[portable]
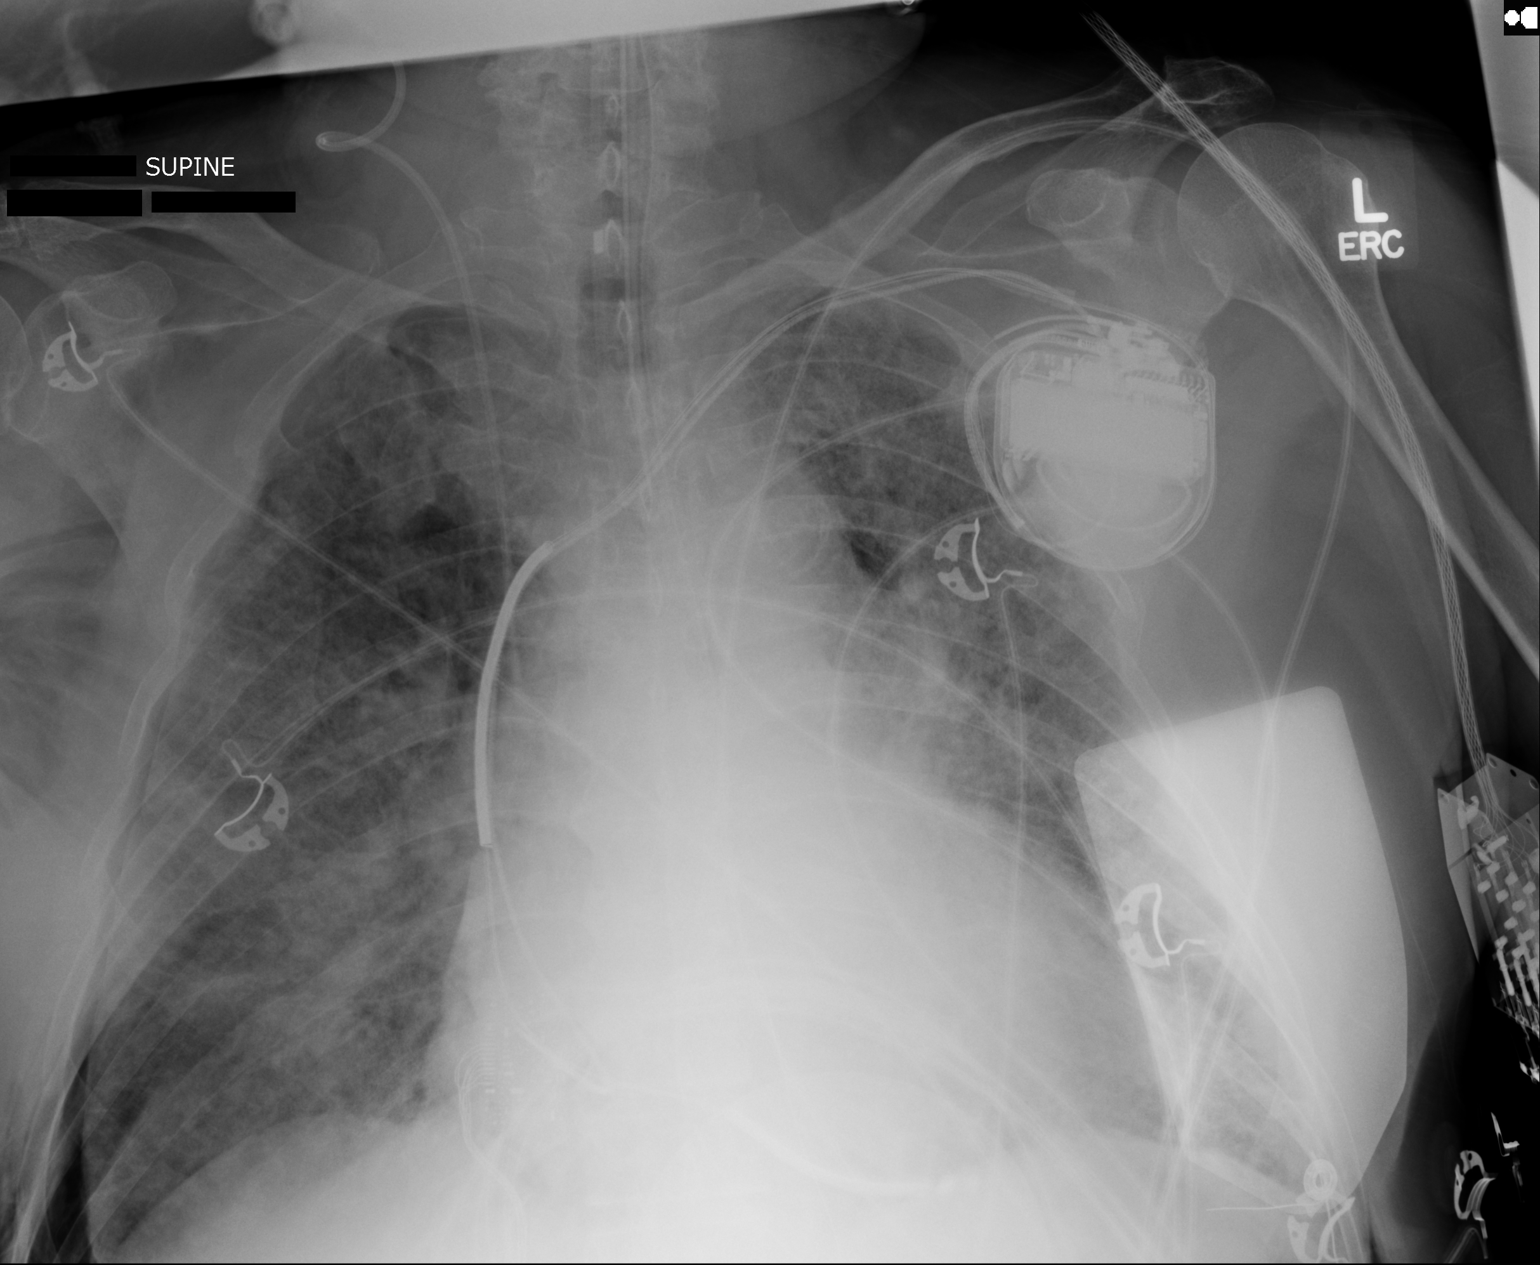

[1 of 1 positions shown; findings below may reference images not displayed]

FINDINGS: There is interstitial prominence and hazy bilateral airspace
opacities with pulmonary edema.

There is small right-sided pneumothorax.  No left pneumothorax.

Right internal jugular central venous line tip projects in the lower
superior vena cava.

Endotracheal tube tip projects 2.3 cm above the carina.

Orogastric tube passes below the diaphragm and below the included
field of view.

Cardiac silhouette is top-normal in size. No mediastinal or hilar
masses.

Left anterior chest wall AICD is well positioned.
IMPRESSION: 1. Small right pneumothorax.
2. Diffuse bilateral lung interstitial thickening and hazy airspace
opacity consistent with pulmonary edema.
3. Right IJ central venous line tip in the lower superior vena cava.
Endotracheal tube tip 2.3 cm above the carina. Nasogastric tube tip
passing below the diaphragm and below the included field of view.
